# Patient Record
Sex: Female | Born: 1955 | Race: Black or African American | Hispanic: No | Marital: Single
Health system: Southern US, Community
[De-identification: ages and names within clinical notes are randomized; demographics above are authoritative.]

---

## 2019-10-28 ENCOUNTER — Other Ambulatory Visit: Payer: Self-pay

## 2019-10-28 ENCOUNTER — Ambulatory Visit (HOSPITAL_COMMUNITY)
Admission: AD | Admit: 2019-10-28 | Discharge: 2019-10-28 | Disposition: A | Discharge status: Home / Self Care | Payer: Medicare Other | Source: Home / Self Care | Attending: Psychiatry | Admitting: Psychiatry

## 2019-10-28 ENCOUNTER — Emergency Department (HOSPITAL_COMMUNITY)
Admission: EM | Admit: 2019-10-28 | Discharge: 2019-10-31 | Disposition: A | Discharge status: Home / Self Care | Payer: Medicare Other | Attending: Emergency Medicine | Admitting: Emergency Medicine

## 2019-10-28 ENCOUNTER — Emergency Department (HOSPITAL_COMMUNITY): Payer: Medicare Other

## 2019-10-28 DIAGNOSIS — Z79899 Other long term (current) drug therapy: Secondary | ICD-10-CM | POA: Insufficient documentation

## 2019-10-28 DIAGNOSIS — F419 Anxiety disorder, unspecified: Secondary | ICD-10-CM | POA: Diagnosis not present

## 2019-10-28 DIAGNOSIS — F329 Major depressive disorder, single episode, unspecified: Secondary | ICD-10-CM | POA: Diagnosis present

## 2019-10-28 DIAGNOSIS — Z20822 Contact with and (suspected) exposure to covid-19: Secondary | ICD-10-CM | POA: Diagnosis not present

## 2019-10-28 DIAGNOSIS — R4182 Altered mental status, unspecified: Secondary | ICD-10-CM | POA: Insufficient documentation

## 2019-10-28 DIAGNOSIS — F332 Major depressive disorder, recurrent severe without psychotic features: Secondary | ICD-10-CM | POA: Diagnosis not present

## 2019-10-28 DIAGNOSIS — F29 Unspecified psychosis not due to a substance or known physiological condition: Secondary | ICD-10-CM

## 2019-10-28 LAB — CBC WITH DIFFERENTIAL/PLATELET
Abs Immature Granulocytes: 0.02 10*3/uL (ref 0.00–0.07)
Basophils Absolute: 0 10*3/uL (ref 0.0–0.1)
Basophils Relative: 0 %
Eosinophils Absolute: 0 10*3/uL (ref 0.0–0.5)
Eosinophils Relative: 0 %
HCT: 35.2 % — ABNORMAL LOW (ref 36.0–46.0)
Hemoglobin: 11.4 g/dL — ABNORMAL LOW (ref 12.0–15.0)
Immature Granulocytes: 0 %
Lymphocytes Relative: 20 %
Lymphs Abs: 1.6 10*3/uL (ref 0.7–4.0)
MCH: 28.6 pg (ref 26.0–34.0)
MCHC: 32.4 g/dL (ref 30.0–36.0)
MCV: 88.4 fL (ref 80.0–100.0)
Monocytes Absolute: 0.3 10*3/uL (ref 0.1–1.0)
Monocytes Relative: 4 %
Neutro Abs: 6.2 10*3/uL (ref 1.7–7.7)
Neutrophils Relative %: 76 %
Platelets: 315 10*3/uL (ref 150–400)
RBC: 3.98 MIL/uL (ref 3.87–5.11)
RDW: 13.9 % (ref 11.5–15.5)
WBC: 8.2 10*3/uL (ref 4.0–10.5)
nRBC: 0 % (ref 0.0–0.2)

## 2019-10-28 LAB — COMPREHENSIVE METABOLIC PANEL
ALT: 26 U/L (ref 0–44)
AST: 26 U/L (ref 15–41)
Albumin: 4.2 g/dL (ref 3.5–5.0)
Alkaline Phosphatase: 60 U/L (ref 38–126)
Anion gap: 11 (ref 5–15)
BUN: 14 mg/dL (ref 8–23)
CO2: 25 mmol/L (ref 22–32)
Calcium: 9.5 mg/dL (ref 8.9–10.3)
Chloride: 104 mmol/L (ref 98–111)
Creatinine, Ser: 0.88 mg/dL (ref 0.44–1.00)
GFR calc Af Amer: >60 mL/min (ref >60)
GFR calc non Af Amer: >60 mL/min (ref >60)
Glucose, Bld: 122 mg/dL — ABNORMAL HIGH (ref 70–99)
Potassium: 4.1 mmol/L (ref 3.5–5.1)
Sodium: 140 mmol/L (ref 135–145)
Total Bilirubin: 0.7 mg/dL (ref 0.3–1.2)
Total Protein: 7.4 g/dL (ref 6.5–8.1)

## 2019-10-28 LAB — RAPID URINE DRUG SCREEN, HOSP PERFORMED
Amphetamines: NOT DETECTED
Barbiturates: NOT DETECTED
Benzodiazepines: NOT DETECTED
Cocaine: NOT DETECTED
Opiates: NOT DETECTED
Tetrahydrocannabinol: NOT DETECTED

## 2019-10-28 LAB — URINALYSIS, ROUTINE W REFLEX MICROSCOPIC
Bilirubin Urine: NEGATIVE
Glucose, UA: NEGATIVE mg/dL
Hgb urine dipstick: NEGATIVE
Ketones, ur: NEGATIVE mg/dL
Leukocytes,Ua: NEGATIVE
Nitrite: NEGATIVE
Protein, ur: NEGATIVE mg/dL
Specific Gravity, Urine: 1.01 (ref 1.005–1.030)
pH: 6 (ref 5.0–8.0)

## 2019-10-28 LAB — AMMONIA: Ammonia: 25 umol/L (ref 9–35)

## 2019-10-28 LAB — RESPIRATORY PANEL BY RT PCR (FLU A&B, COVID)
Influenza A by PCR: NEGATIVE
Influenza B by PCR: NEGATIVE
SARS Coronavirus 2 by RT PCR: NEGATIVE

## 2019-10-28 LAB — SALICYLATE LEVEL: Salicylate Lvl: <7 mg/dL — ABNORMAL LOW (ref 7.0–30.0)

## 2019-10-28 LAB — ETHANOL: Alcohol, Ethyl (B): <10 mg/dL (ref <10)

## 2019-10-28 LAB — ACETAMINOPHEN LEVEL: Acetaminophen (Tylenol), Serum: <10 ug/mL — ABNORMAL LOW (ref 10–30)

## 2019-10-28 MED ORDER — ACETAMINOPHEN 325 MG PO TABS
650.0000 mg | ORAL_TABLET | Freq: Four times a day (QID) | ORAL | Status: DC | PRN
  Administered 2019-10-28: 20:00:00 650 mg via ORAL
  Filled 2019-10-28: qty 2

## 2019-10-28 MED ORDER — HYDROXYZINE HCL 25 MG PO TABS
25.0000 mg | ORAL_TABLET | Freq: Once | ORAL | Status: AC
  Administered 2019-10-28: 25 mg via ORAL
  Filled 2019-10-28: qty 1

## 2019-10-28 NOTE — Progress Notes (Signed)
Patient meets inpatient criteria per Malachy Chamber, NP. Patient has been faxed out to the following facilities for review:   [facility]  [facility]  [facility]  [facility]  [facility]  TTS will continue to follow and assist with securing bed placement.   Drucilla Schmidt, MSW, LCSW-A Clinical Disposition Social Worker [facility]/TTS [phone]

## 2019-10-28 NOTE — ED Provider Notes (Addendum)
[name] Provider Note   CSN: [id number] Arrival date & time: 10/28/19  1636     History Chief Complaint  Patient presents with  . Medical Clearance    IVC    Christina Cruz is a 64 y.o. female.  Patient is a 64 year old female without significant medical history presenting today under IVC due to bizarre behavior, paranoia, threats to hurt her daughter and hallucinating and talking to her dead mother.  Patient has no prior psychiatric diagnosis but daughter states this has happened before.  Patient does admit to taking large amounts of caffeine and phentermine.  She reports she has not been sleeping well and sometimes takes medication that makes her drowsy.  She is unable to give much history and continues to look at the bed and in the drawers like she is looking for something but will not say what.  She denies any chest pain, shortness of breath or abdominal pain.  She reports she has been eating okay.  Patient was seen by behavioral health and sent over for medical clearance.  The history is provided by medical records and a relative.       No past medical history on file.  There are no problems to display for this patient.      OB History   No obstetric history on file.     No family history on file.  Social History   Tobacco Use  . Smoking status: Not on file  Substance Use Topics  . Alcohol use: Not on file  . Drug use: Not on file    Home Medications Prior to Admission medications   Not on File    Allergies    Patient has no allergy information on record.  Review of Systems   Review of Systems  All other systems reviewed and are negative.   Physical Exam Updated Vital Signs BP 131/87 (BP Location: Right Arm)   Pulse (!) 105   Temp 98.6 F (37 C) (Oral)   Resp 16   SpO2 100%   Physical Exam Vitals and nursing note reviewed.  Constitutional:      General: She is not in acute distress.    Appearance:  Normal appearance. She is well-developed and normal weight.  HENT:     Head: Normocephalic and atraumatic.  Eyes:     Pupils: Pupils are equal, round, and reactive to light.     Comments: Folds are 3 mm bilaterally and reactive  Cardiovascular:     Rate and Rhythm: Regular rhythm. Tachycardia present.     Heart sounds: Normal heart sounds. No murmur. No friction rub.  Pulmonary:     Effort: Pulmonary effort is normal.     Breath sounds: Normal breath sounds. No wheezing or rales.  Abdominal:     General: Bowel sounds are normal. There is no distension.     Palpations: Abdomen is soft.     Tenderness: There is no abdominal tenderness. There is no guarding or rebound.  Musculoskeletal:        General: No tenderness. Normal range of motion.     Comments: No edema  Skin:    General: Skin is warm and dry.     Findings: No rash.  Neurological:     Mental Status: She is alert and oriented to person, place, and time.     Cranial Nerves: No cranial nerve deficit.     Comments: No tremor or clonus noted  Psychiatric:  Comments: Patient with bizarre behavior.  She seems drowsy but yet is also hyperactive.  She is looking at the bed looking under the bed opening the closet doors but cannot state what she is looking for.  Also she seems very disorganized and difficult to understand.     ED Results / Procedures / Treatments   Labs (all labs ordered are listed, but only abnormal results are displayed) Labs Reviewed  CBC WITH DIFFERENTIAL/PLATELET - Abnormal; Notable for the following components:      Result Value   Hemoglobin 11.4 (*)    HCT 35.2 (*)    All other components within normal limits  COMPREHENSIVE METABOLIC PANEL - Abnormal; Notable for the following components:   Glucose, Bld 122 (*)    All other components within normal limits  SALICYLATE LEVEL - Abnormal; Notable for the following components:   Salicylate Lvl <7.6 (*)    All other components within normal limits    ACETAMINOPHEN LEVEL - Abnormal; Notable for the following components:   Acetaminophen (Tylenol), Serum <10 (*)    All other components within normal limits  ETHANOL  RAPID URINE DRUG SCREEN, HOSP PERFORMED  AMMONIA  URINALYSIS, ROUTINE W REFLEX MICROSCOPIC    EKG EKG Interpretation  Date/Time:  Friday October 28 2019 17:26:36 EDT Ventricular Rate:  95 PR Interval:  176 QRS Duration: 82 QT Interval:  346 QTC Calculation: 434 R Axis:   171 Text Interpretation: Normal sinus rhythm Right axis deviation No previous tracing Confirmed by Blanchie Dessert [phone]) on 10/28/2019 5:37:22 PM   Radiology CT Head Wo Contrast  Result Date: 10/28/2019 CLINICAL DATA:  Altered mental status (AMS), unclear cause. EXAM: CT HEAD WITHOUT CONTRAST TECHNIQUE: Contiguous axial images were obtained from the base of the skull through the vertex without intravenous contrast. COMPARISON:  No pertinent prior studies available for comparison. FINDINGS: Brain: There is no acute intracranial hemorrhage. No demarcated cortical infarct. No extra-axial fluid collection. No evidence of intracranial mass. No midline shift. Vascular: No hyperdense vessel. Skull: Normal. Negative for fracture or focal lesion. Sinuses/Orbits: Visualized orbits show no acute finding. No significant paranasal sinus disease or mastoid effusion at the imaged levels. IMPRESSION: Unremarkable non-contrast CT appearance of the brain. No evidence of acute intracranial abnormality. Electronically Signed   By: Kellie Simmering DO   On: 10/28/2019 18:33   DG Chest Port 1 View  Result Date: 10/28/2019 CLINICAL DATA:  Medical clearance for facility placement, retic behavior and medical noncompliance, increasing aggression EXAM: PORTABLE CHEST 1 VIEW COMPARISON:  None. FINDINGS: No consolidation, features of edema, pneumothorax, or effusion. Pulmonary vascularity is normally distributed. The aorta is calcified. The remaining cardiomediastinal contours are  unremarkable. No acute osseous or soft tissue abnormality. Degenerative changes are present in the imaged spine and shoulders. IMPRESSION: No acute cardiopulmonary abnormality. Aortic Atherosclerosis (ICD10-I70.0). Electronically Signed   By: Lovena Le M.D.   On: 10/28/2019 18:11    Procedures Procedures (including critical care time)  Medications Ordered in ED Medications - No data to display  ED Course  I have reviewed the triage vital signs and the nursing notes.  Pertinent labs & imaging results that were available during my care of the patient were reviewed by me and considered in my medical decision making (see chart for details).    MDM Rules/Calculators/A&P                      64 year old female presenting today with psychosis, general delayed speech with some  mild slurred speech but also erratic bizarre behavior.  Patient was seen by behavioral health and this all could be psychiatric illness however given patient is 89 without much prior history and no psychiatric diagnosis patient needs medical clearance first.  Patient has no focal weakness and is able to walk around the room without difficulty.  No strokelike symptoms at this time.  Potential ingestion versus brain tumor versus abnormal electrolytes as patient's cause.  Will medically clear before contacting psychiatry.  Patient is currently under IVC.  6:29 PM Pt's labs all reassuring and EKG without acute pathology.  CXR wnl and head CT is neg.  Feel that pt is medically clear and stable for psych eval.  First impression completed.  MDM Number of Diagnoses or Management Options   Amount and/or Complexity of Data Reviewed Clinical lab tests: ordered and reviewed Tests in the radiology section of CPT: ordered and reviewed Tests in the medicine section of CPT: ordered and reviewed Decide to obtain previous medical records or to obtain history from someone other than the patient: yes Obtain history from someone other  than the patient: yes Review and summarize past medical records: yes Discuss the patient with other providers: yes Independent visualization of images, tracings, or specimens: yes  Risk of Complications, Morbidity, and/or Mortality Presenting problems: moderate Diagnostic procedures: low Management options: low  Patient Progress Patient progress: stable    Final Clinical Impression(s) / ED Diagnoses Final diagnoses:  Psychosis, unspecified psychosis type [facility])    Rx / DC Orders ED Discharge Orders    None       Gwyneth Sprout, MD 10/28/19 1831    Gwyneth Sprout, MD 10/28/19 [phone]
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
## 2019-10-28 NOTE — ED Triage Notes (Signed)
Pt brought to [facility] from [facility] by [employer]. Pt is IVC . Erratic behavior and non compliance of medication.pt has bee aggressive with family.  Pt has a hx of mental illness. Pt cooperative, dressed out, rambling, slurred speech.

## 2019-10-28 NOTE — ED Notes (Signed)
Patient resting quietly.

## 2019-10-28 NOTE — H&P (Signed)
Behavioral Health Medical Screening Exam  Christina Cruz is an 64 y.o. female with psychosis, paranoia, bizarre behaviors. She is under IVC by her daughter for the above. She denies any previous history of psych, however daughter reports this has happened before many years ago. Patient with garbled speech yet she remains coherent throughout the evaluation.    Total Time spent with patient: 30 minutes  Psychiatric Specialty Exam: Physical Exam  Nursing note and vitals reviewed. Constitutional: She appears well-developed and well-nourished.  HENT:  Head: Normocephalic.  Eyes: Pupils are equal, round, and reactive to light.  Cardiovascular: Normal rate.  Musculoskeletal:        General: Normal range of motion.     Cervical back: Normal range of motion.  Neurological: She is alert.  Skin: Skin is warm and dry.    Review of Systems  There were no vitals taken for this visit.There is no height or weight on file to calculate BMI.  General Appearance: Fairly Groomed  Eye Contact:  Fair  Speech:  Garbled, Normal Rate and clear at times  Volume:  Normal  Mood:  Anxious  Affect:  Blunt and Restricted  Thought Process:  Disorganized, Irrelevant and Descriptions of Associations: Loose  Orientation:  Full (Time, Place, and Person)  Thought Content:  Illogical and Rumination  Suicidal Thoughts:  No  Homicidal Thoughts:  No  Memory:  Immediate;   Fair Recent;   Fair  Judgement:  Impaired  Insight:  Shallow  Psychomotor Activity:  Restlessness  Concentration: Concentration: Fair and Attention Span: Fair  Recall:  Poor  Fund of Knowledge:Poor  Language: Fair  Akathisia:  No  Handed:  Right  AIMS (if indicated):     Assets:  Communication Skills Desire for Improvement Financial Resources/Insurance Leisure Time Physical Health Social Support Transportation Vocational/Educational  Sleep:       Musculoskeletal: Strength & Muscle Tone: within normal limits Gait & Station:  normal Patient leans: N/A  There were no vitals taken for this visit.  Recommendations:   Based on my evaluation the patient appears to have an emergency medical condition for which I recommend the patient be transferred to the emergency department for further evaluation. Will send for medical clearance at this time. Patient wirh suspected abuse of phentermine, and ingestion of large amounts of caffeine daily. SHe has history of valve prolapse. Once she is medically cleared will fax out for psych. Patient is independent of all ADLs and appears to be groomed well, no apparent need for geropsych.   Maryagnes Amos, FNP 10/28/2019, 3:41 PM

## 2019-10-29 DIAGNOSIS — F332 Major depressive disorder, recurrent severe without psychotic features: Secondary | ICD-10-CM | POA: Diagnosis present

## 2019-10-29 MED ORDER — OLANZAPINE 2.5 MG PO TABS
2.5000 mg | ORAL_TABLET | Freq: Two times a day (BID) | ORAL | Status: DC
  Administered 2019-10-29 – 2019-10-31 (×4): 2.5 mg via ORAL
  Filled 2019-10-29 (×5): qty 1

## 2019-10-29 MED ORDER — BUPROPION HCL ER (SR) 100 MG PO TB12
200.0000 mg | ORAL_TABLET | Freq: Two times a day (BID) | ORAL | Status: DC
  Administered 2019-10-29: 200 mg via ORAL
  Filled 2019-10-29: qty 2

## 2019-10-29 MED ORDER — CITALOPRAM HYDROBROMIDE 10 MG PO TABS
20.0000 mg | ORAL_TABLET | Freq: Every day | ORAL | Status: DC
  Administered 2019-10-29 – 2019-10-31 (×3): 20 mg via ORAL
  Filled 2019-10-29 (×3): qty 2

## 2019-10-29 MED ORDER — HYDROXYZINE HCL 25 MG PO TABS
25.0000 mg | ORAL_TABLET | Freq: Three times a day (TID) | ORAL | Status: DC | PRN
  Administered 2019-10-29: 25 mg via ORAL
  Filled 2019-10-29: qty 1

## 2019-10-29 MED ORDER — QUETIAPINE FUMARATE 100 MG PO TABS
100.0000 mg | ORAL_TABLET | Freq: Every day | ORAL | Status: DC
  Administered 2019-10-29: 100 mg via ORAL
  Filled 2019-10-29: qty 1

## 2019-10-29 NOTE — Consult Note (Signed)
[facility] Face-to-Face Psychiatry Consult   Reason for Consult:  Psychosis Referring Physician:  EDP Patient Identification: Christina Cruz MRN:  [id number] Principal Diagnosis: Major depressive disorder, recurrent severe without psychotic features (HCC) Diagnosis:  Principal Problem:   Major depressive disorder, recurrent severe without psychotic features (HCC)  Total Time spent with patient: 1 hour  Subjective:   Christina Cruz is a 64 y.o. female patient admitted with suicidal ideations and plan to overdose.  Patient seen and evaluated in person by this provider.  She reports, "I have been seriously depressed for the past two months, having difficulty getting words out."  No difficulty talking during the assessment. 10/10 depression with suicidal ideations,plan to overdose.  Past history of depression and reports a bipolar diagnosis--chart review vacillates on diagnoses.  Reports taking her medications "on and off".  Anxious abut the stress in her life, especially to her now homeless status as she was evicted from her hotel yesterday.  Denies homicidal ideations, hallucinations, and mania symptoms. Her daughter reports she abuses phentermine and alcohol, BAL negative on admission.  Patient denies using alcohol.    HPI per MD on admission:  Patient is a 64 year old female without significant medical history presenting today under IVC due to bizarre behavior, paranoia, threats to hurt her daughter and hallucinating and talking to her dead mother.  Patient has no prior psychiatric diagnosis but daughter states this has happened before.  Patient does admit to taking large amounts of caffeine and phentermine.  She reports she has not been sleeping well and sometimes takes medication that makes her drowsy.  She is unable to give much history and continues to look at the bed and in the drawers like she is looking for something but will not say what.  She denies any chest pain, shortness of breath or abdominal  pain.  She reports she has been eating okay.  Patient was seen by behavioral health and sent over for medical clearance  Past Psychiatric History:  Depression, anxiety  Risk to Self:  yes Risk to Others:  none Prior Inpatient Therapy:  yes Prior Outpatient Therapy:  yes  Past Medical History: Denies Family History: No family history on file. Family Psychiatric  History: none Social History:  Social History   Substance and Sexual Activity  Alcohol Use Not on file     Social History   Substance and Sexual Activity  Drug Use Not on file    Social History   Socioeconomic History  . Marital status: Single    Spouse name: Not on file  . Number of children: Not on file  . Years of education: Not on file  . Highest education level: Not on file  Occupational History  . Not on file  Tobacco Use  . Smoking status: Not on file  Substance and Sexual Activity  . Alcohol use: Not on file  . Drug use: Not on file  . Sexual activity: Not on file  Other Topics Concern  . Not on file  Social History Narrative  . Not on file   Social Determinants of Health   Financial Resource Strain:   . Difficulty of Paying Living Expenses:   Food Insecurity:   . Worried About Programme researcher, broadcasting/film/video in the Last Year:   . Barista in the Last Year:   Transportation Needs:   . Freight forwarder (Medical):   Marland Kitchen Lack of Transportation (Non-Medical):   Physical Activity:   . Days of Exercise per Week:   .  Minutes of Exercise per Session:   Stress:   . Feeling of Stress :   Social Connections:   . Frequency of Communication with Friends and Family:   . Frequency of Social Gatherings with Friends and Family:   . Attends Religious Services:   . Active Member of Clubs or Organizations:   . Attends BankerClub or Organization Meetings:   Marland Kitchen. Marital Status:    Additional Social History:    Allergies:  No Known Allergies  Labs:  Results for orders placed or performed during the hospital  encounter of 10/28/19 (from the past 48 hour(s))  Rapid urine drug screen (hospital performed)     Status: None   Collection Time: 10/28/19  5:12 PM  Result Value Ref Range   Opiates NONE DETECTED NONE DETECTED   Cocaine NONE DETECTED NONE DETECTED   Benzodiazepines NONE DETECTED NONE DETECTED   Amphetamines NONE DETECTED NONE DETECTED   Tetrahydrocannabinol NONE DETECTED NONE DETECTED   Barbiturates NONE DETECTED NONE DETECTED    Comment: (NOTE) DRUG SCREEN FOR MEDICAL PURPOSES ONLY.  IF CONFIRMATION IS NEEDED FOR ANY PURPOSE, NOTIFY LAB WITHIN 5 DAYS. LOWEST DETECTABLE LIMITS FOR URINE DRUG SCREEN Drug Class                     Cutoff (ng/mL) Amphetamine and metabolites    1000 Barbiturate and metabolites    200 Benzodiazepine                 200 Tricyclics and metabolites     300 Opiates and metabolites        300 Cocaine and metabolites        300 THC                            50 Performed at [facility], [address] [place], [place] [address]   Urinalysis, Routine w reflex microscopic     Status: None   Collection Time: 10/28/19  5:12 PM  Result Value Ref Range   Color, Urine YELLOW YELLOW   APPearance CLEAR CLEAR   Specific Gravity, Urine 1.010 1.005 - 1.030   pH 6.0 5.0 - 8.0   Glucose, UA NEGATIVE NEGATIVE mg/dL   Hgb urine dipstick NEGATIVE NEGATIVE   Bilirubin Urine NEGATIVE NEGATIVE   Ketones, ur NEGATIVE NEGATIVE mg/dL   Protein, ur NEGATIVE NEGATIVE mg/dL   Nitrite NEGATIVE NEGATIVE   Leukocytes,Ua NEGATIVE NEGATIVE    Comment: Performed at [facility], [address] [place], [place] [address]  CBC with Differential/Platelet     Status: Abnormal   Collection Time: 10/28/19  5:39 PM  Result Value Ref Range   WBC 8.2 4.0 - 10.5 K/uL   RBC 3.98 3.87 - 5.11 MIL/uL   Hemoglobin 11.4 (L) 12.0 - 15.0 g/dL   HCT 24.435.2 (L) 01.036.0 - 27.246.0 %   MCV 88.4 80.0 - 100.0 fL   MCH 28.6 26.0 - 34.0 pg   MCHC 32.4 30.0 - 36.0  g/dL   RDW 53.613.9 64.411.5 - 03.415.5 %   Platelets 315 150 - 400 K/uL   nRBC 0.0 0.0 - 0.2 %   Neutrophils Relative % 76 %   Neutro Abs 6.2 1.7 - 7.7 K/uL   Lymphocytes Relative 20 %   Lymphs Abs 1.6 0.7 - 4.0 K/uL   Monocytes Relative 4 %   Monocytes Absolute 0.3 0.1 - 1.0 K/uL   Eosinophils Relative  0 %   Eosinophils Absolute 0.0 0.0 - 0.5 K/uL   Basophils Relative 0 %   Basophils Absolute 0.0 0.0 - 0.1 K/uL   Immature Granulocytes 0 %   Abs Immature Granulocytes 0.02 0.00 - 0.07 K/uL    Comment: Performed at [facility], [address], [place], [place] [address]  Comprehensive metabolic panel     Status: Abnormal   Collection Time: 10/28/19  5:39 PM  Result Value Ref Range   Sodium 140 135 - 145 mmol/L   Potassium 4.1 3.5 - 5.1 mmol/L   Chloride 104 98 - 111 mmol/L   CO2 25 22 - 32 mmol/L   Glucose, Bld 122 (H) 70 - 99 mg/dL    Comment: Glucose reference range applies only to samples taken after fasting for at least 8 hours.   BUN 14 8 - 23 mg/dL   Creatinine, Ser 6.06 0.44 - 1.00 mg/dL   Calcium 9.5 8.9 - 30.1 mg/dL   Total Protein 7.4 6.5 - 8.1 g/dL   Albumin 4.2 3.5 - 5.0 g/dL   AST 26 15 - 41 U/L   ALT 26 0 - 44 U/L   Alkaline Phosphatase 60 38 - 126 U/L   Total Bilirubin 0.7 0.3 - 1.2 mg/dL   GFR calc non Af Amer >60 >60 mL/min   GFR calc Af Amer >60 >60 mL/min   Anion gap 11 5 - 15    Comment: Performed at [facility], [address], [place], [place] [address]  Ethanol     Status: None   Collection Time: 10/28/19  5:39 PM  Result Value Ref Range   Alcohol, Ethyl (B) <10 <10 mg/dL    Comment: (NOTE) Lowest detectable limit for serum alcohol is 10 mg/dL. For medical purposes only. Performed at [facility], [address], [place], [place] [address]   Salicylate level     Status: Abnormal   Collection Time: 10/28/19  5:39 PM  Result Value Ref Range   Salicylate Lvl <7.0 (L) 7.0 - 30.0 mg/dL    Comment:  Performed at [facility], [address], [place], [place] [address]  Acetaminophen level     Status: Abnormal   Collection Time: 10/28/19  5:39 PM  Result Value Ref Range   Acetaminophen (Tylenol), Serum <10 (L) 10 - 30 ug/mL    Comment: (NOTE) Therapeutic concentrations vary significantly. A range of 10-30 ug/mL  may be an effective concentration for many patients. However, some  are best treated at concentrations outside of this range. Acetaminophen concentrations >150 ug/mL at 4 hours after ingestion  and >50 ug/mL at 12 hours after ingestion are often associated with  toxic reactions. Performed at [facility], [address] [place], [place] [address]   Ammonia     Status: None   Collection Time: 10/28/19  5:39 PM  Result Value Ref Range   Ammonia 25 9 - 35 umol/L    Comment: Performed at [facility], [address] [place], [place] [address]  Respiratory Panel by RT PCR (Flu A&B, Covid) - Nasopharyngeal Swab     Status: None   Collection Time: 10/28/19  6:53 PM   Specimen: Nasopharyngeal Swab  Result Value Ref Range   SARS Coronavirus 2 by RT PCR NEGATIVE NEGATIVE    Comment: (NOTE) SARS-CoV-2 target nucleic acids are NOT DETECTED. The SARS-CoV-2 RNA is generally detectable in upper respiratoy specimens during the acute phase of  infection. The lowest concentration of SARS-CoV-2 viral copies this assay can detect is 131 copies/mL. A negative result does not preclude SARS-Cov-2 infection and should not be used as the sole basis for treatment or other patient management decisions. A negative result may occur with  improper specimen collection/handling, submission of specimen other than nasopharyngeal swab, presence of viral mutation(s) within the areas targeted by this assay, and inadequate number of viral copies (<131 copies/mL). A negative result must be combined with clinical observations, patient history, and  epidemiological information. The expected result is Negative. Fact Sheet for Patients:  PinkCheek.be Fact Sheet for Healthcare Providers:  GravelBags.it This test is not yet ap proved or cleared by the [place] FDA and  has been authorized for detection and/or diagnosis of SARS-CoV-2 by FDA under an Emergency Use Authorization (EUA). This EUA will remain  in effect (meaning this test can be used) for the duration of the COVID-19 declaration under Section 564(b)(1) of the Act, 21 U.S.C. section 360bbb-3(b)(1), unless the authorization is terminated or revoked sooner.    Influenza A by PCR NEGATIVE NEGATIVE   Influenza B by PCR NEGATIVE NEGATIVE    Comment: (NOTE) The Xpert Xpress SARS-CoV-2/FLU/RSV assay is intended as an aid in  the diagnosis of influenza from Nasopharyngeal swab specimens and  should not be used as a sole basis for treatment. Nasal washings and  aspirates are unacceptable for Xpert Xpress SARS-CoV-2/FLU/RSV  testing. Fact Sheet for Patients: PinkCheek.be Fact Sheet for Healthcare Providers: GravelBags.it This test is not yet approved or cleared by the [place] FDA and  has been authorized for detection and/or diagnosis of SARS-CoV-2 by  FDA under an Emergency Use Authorization (EUA). This EUA will remain  in effect (meaning this test can be used) for the duration of the  Covid-19 declaration under Section 564(b)(1) of the Act, 21  U.S.C. section 360bbb-3(b)(1), unless the authorization is  terminated or revoked. Performed at [facility], [place] [address] [place], [place] [address]     Current Facility-Administered Medications  Medication Dose Route Frequency Provider Last Rate Last Admin  . acetaminophen (TYLENOL) tablet 650 mg  650 mg Oral Q6H PRN Blanchie Dessert, MD   650 mg at 10/28/19 2024  . buPROPion  [facility] SR) 12 hr tablet 200 mg  200 mg Oral BID Ward, Kristen N, DO   200 mg at 10/29/19 8841  . hydrOXYzine (ATARAX/VISTARIL) tablet 25 mg  25 mg Oral TID PRN Ward, Delice Bison, DO      . QUEtiapine (SEROQUEL) tablet 100 mg  100 mg Oral QHS Ward, Kristen N, DO   100 mg at 10/29/19 0124   Current Outpatient Medications  Medication Sig Dispense Refill  . buPROPion (WELLBUTRIN SR) 200 MG 12 hr tablet Take 200 mg by mouth 2 (two) times daily.    . hydrOXYzine (VISTARIL) 25 MG capsule 25-50 mg every 8 (eight) hours as needed for anxiety.     Marland Kitchen QUEtiapine (SEROQUEL) 100 MG tablet Take 100 mg by mouth at bedtime.      Musculoskeletal: Strength & Muscle Tone: within normal limits Gait & Station: normal Patient leans: N/A  Psychiatric Specialty Exam: Physical Exam  Nursing note and vitals reviewed. Constitutional: She is oriented to person, place, and time. She appears well-developed and well-nourished.  HENT:  Head: Normocephalic.  Respiratory: Effort normal.  Musculoskeletal:        General: Normal range of motion.     Cervical back: Normal range of motion.  Neurological:  She is alert and oriented to person, place, and time.  Psychiatric: Her speech is normal and behavior is normal. Her mood appears anxious. Cognition and memory are normal. She expresses impulsivity. She exhibits a depressed mood. She expresses suicidal ideation.    Review of Systems  Psychiatric/Behavioral: Positive for dysphoric mood and suicidal ideas. The patient is nervous/anxious.   All other systems reviewed and are negative.   Blood pressure 122/81, pulse (!) 106, temperature 98.5 F (36.9 C), temperature source Oral, resp. rate 18, SpO2 99 %.There is no height or weight on file to calculate BMI.  General Appearance: Casual  Eye Contact:  Good  Speech:  Clear and Coherent  Volume:  Normal  Mood:  Anxious and Depressed  Affect:  Congruent  Thought Process:  Coherent and Descriptions of Associations:  Intact  Orientation:  Full (Time, Place, and Person)  Thought Content:  Rumination  Suicidal Thoughts:  Yes.  with intent/plan  Homicidal Thoughts:  No  Memory:  Immediate;   Fair Recent;   Fair Remote;   Fair  Judgement:  Poor  Insight:  Fair  Psychomotor Activity:  Normal  Concentration:  Concentration: Fair and Attention Span: Fair  Recall:  [employer] of Knowledge:  Fair  Language:  Good  Akathisia:  No  Handed:  Right  AIMS (if indicated):     Assets:  Leisure Time Physical Health Resilience Social Support  ADL's:  Intact  Cognition:  WNL  Sleep:        Treatment Plan Summary: Daily contact with patient to assess and evaluate symptoms and progress in treatment, Medication management and Plan major depressive disorder, recurrent, severe without psychosis:  -Discontinued Wellbutrin 200 mg BID, has not been taking it -Started Celexa 20 mg daily -Discontinued Seroquel 100 mg at bedtime -Started Zyprexa 2.5 mg BID  Disposition: Recommend psychiatric Inpatient admission when medically cleared.  Nanine Means, NP 10/29/2019 9:52 AM

## 2019-10-29 NOTE — ED Notes (Signed)
Pt showered without assistance and the linen in the room was changed.

## 2019-10-29 NOTE — ED Notes (Signed)
Pt expressed concern about her belongings, especially her purse and ID being locked up at the [facility]. This Clinical research associate verified for her that the only belongings here are the clothes she was wearing including shoes, a cell phone with charger and pair of glasses.

## 2019-10-30 NOTE — Progress Notes (Signed)
Pt has been accepted to [facility] per [facility] with admissions. Official acceptance will be provided by Morrie Sheldon on Monday 10/31/19 once discharges from their unit are completed.    Ruthann Cancer MSW, LCSWA Clincal Social Worker Disposition  [facility] Ph: [phone] Fax: [phone]

## 2019-10-30 NOTE — Consult Note (Signed)
[facility] Face-to-Face Psychiatry Consult   Reason for Consult:  Psychosis Referring Physician:  EDP Patient Identification: Christina Cruz MRN:  [id number] Principal Diagnosis: Major depressive disorder, recurrent severe without psychotic features (HCC) Diagnosis:  Principal Problem:   Major depressive disorder, recurrent severe without psychotic features (HCC)  Total Time spent with patient: 1 hour  Subjective:   Christina Cruz is a 64 y.o. female patient admitted with suicidal ideations and plan to overdose.  Patient seen and evaluated in person by this provider.  She reports slight improvement after resting, eating, and medications.  Continues to endorse suicidal ideations with inability to contract for safety.  She has been accepted to [facility] geriatric psychiatry pending EKG and IVC paperwork at this time and will transfer in the morning.  10/29/19: Patient seen and evaluated in person by this provider.  She reports, "I have been seriously depressed for the past two months, having difficulty getting words out."  No difficulty talking during the assessment. 10/10 depression with suicidal ideations,plan to overdose.  Past history of depression and reports a bipolar diagnosis--chart review vacillates on diagnoses.  Reports taking her medications "on and off".  Anxious abut the stress in her life, especially to her now homeless status as she was evicted from her hotel yesterday.  Denies homicidal ideations, hallucinations, and mania symptoms. Her daughter reports she abuses phentermine and alcohol, BAL negative on admission.  Patient denies using alcohol.    HPI per MD on admission:  Patient is a 64 year old female without significant medical history presenting today under IVC due to bizarre behavior, paranoia, threats to hurt her daughter and hallucinating and talking to her dead mother.  Patient has no prior psychiatric diagnosis but daughter states this has happened before.  Patient does admit to  taking large amounts of caffeine and phentermine.  She reports she has not been sleeping well and sometimes takes medication that makes her drowsy.  She is unable to give much history and continues to look at the bed and in the drawers like she is looking for something but will not say what.  She denies any chest pain, shortness of breath or abdominal pain.  She reports she has been eating okay.  Patient was seen by behavioral health and sent over for medical clearance  Past Psychiatric History:  Depression, anxiety  Risk to Self:  yes Risk to Others:  none Prior Inpatient Therapy:  yes Prior Outpatient Therapy:  yes  Past Medical History: Denies Family History: No family history on file. Family Psychiatric  History: none Social History:  Social History   Substance and Sexual Activity  Alcohol Use Not on file     Social History   Substance and Sexual Activity  Drug Use Not on file    Social History   Socioeconomic History  . Marital status: Single    Spouse name: Not on file  . Number of children: Not on file  . Years of education: Not on file  . Highest education level: Not on file  Occupational History  . Not on file  Tobacco Use  . Smoking status: Not on file  Substance and Sexual Activity  . Alcohol use: Not on file  . Drug use: Not on file  . Sexual activity: Not on file  Other Topics Concern  . Not on file  Social History Narrative  . Not on file   Social Determinants of Health   Financial Resource Strain:   . Difficulty of Paying Living Expenses:   Food  Insecurity:   . Worried About Programme researcher, broadcasting/film/video in the Last Year:   . Barista in the Last Year:   Transportation Needs:   . Freight forwarder (Medical):   Marland Kitchen Lack of Transportation (Non-Medical):   Physical Activity:   . Days of Exercise per Week:   . Minutes of Exercise per Session:   Stress:   . Feeling of Stress :   Social Connections:   . Frequency of Communication with Friends and  Family:   . Frequency of Social Gatherings with Friends and Family:   . Attends Religious Services:   . Active Member of Clubs or Organizations:   . Attends Banker Meetings:   Marland Kitchen Marital Status:    Additional Social History:    Allergies:  No Known Allergies  Labs:  Results for orders placed or performed during the hospital encounter of 10/28/19 (from the past 48 hour(s))  Rapid urine drug screen (hospital performed)     Status: None   Collection Time: 10/28/19  5:12 PM  Result Value Ref Range   Opiates NONE DETECTED NONE DETECTED   Cocaine NONE DETECTED NONE DETECTED   Benzodiazepines NONE DETECTED NONE DETECTED   Amphetamines NONE DETECTED NONE DETECTED   Tetrahydrocannabinol NONE DETECTED NONE DETECTED   Barbiturates NONE DETECTED NONE DETECTED    Comment: (NOTE) DRUG SCREEN FOR MEDICAL PURPOSES ONLY.  IF CONFIRMATION IS NEEDED FOR ANY PURPOSE, NOTIFY LAB WITHIN 5 DAYS. LOWEST DETECTABLE LIMITS FOR URINE DRUG SCREEN Drug Class                     Cutoff (ng/mL) Amphetamine and metabolites    1000 Barbiturate and metabolites    200 Benzodiazepine                 200 Tricyclics and metabolites     300 Opiates and metabolites        300 Cocaine and metabolites        300 THC                            50 Performed at [facility], [address], [place], [place] [address]   Urinalysis, Routine w reflex microscopic     Status: None   Collection Time: 10/28/19  5:12 PM  Result Value Ref Range   Color, Urine YELLOW YELLOW   APPearance CLEAR CLEAR   Specific Gravity, Urine 1.010 1.005 - 1.030   pH 6.0 5.0 - 8.0   Glucose, UA NEGATIVE NEGATIVE mg/dL   Hgb urine dipstick NEGATIVE NEGATIVE   Bilirubin Urine NEGATIVE NEGATIVE   Ketones, ur NEGATIVE NEGATIVE mg/dL   Protein, ur NEGATIVE NEGATIVE mg/dL   Nitrite NEGATIVE NEGATIVE   Leukocytes,Ua NEGATIVE NEGATIVE    Comment: Performed at [facility], [address] [place], [place] [address]  CBC with Differential/Platelet     Status: Abnormal   Collection Time: 10/28/19  5:39 PM  Result Value Ref Range   WBC 8.2 4.0 - 10.5 K/uL   RBC 3.98 3.87 - 5.11 MIL/uL   Hemoglobin 11.4 (L) 12.0 - 15.0 g/dL   HCT 56.4 (L) 33.2 - 95.1 %   MCV 88.4 80.0 - 100.0 fL   MCH 28.6 26.0 - 34.0 pg   MCHC 32.4 30.0 - 36.0 g/dL   RDW 88.4 16.6 - 06.3 %   Platelets 315 150 - 400 K/uL  nRBC 0.0 0.0 - 0.2 %   Neutrophils Relative % 76 %   Neutro Abs 6.2 1.7 - 7.7 K/uL   Lymphocytes Relative 20 %   Lymphs Abs 1.6 0.7 - 4.0 K/uL   Monocytes Relative 4 %   Monocytes Absolute 0.3 0.1 - 1.0 K/uL   Eosinophils Relative 0 %   Eosinophils Absolute 0.0 0.0 - 0.5 K/uL   Basophils Relative 0 %   Basophils Absolute 0.0 0.0 - 0.1 K/uL   Immature Granulocytes 0 %   Abs Immature Granulocytes 0.02 0.00 - 0.07 K/uL    Comment: Performed at [facility], [place] [address] [place], [place] [address]  Comprehensive metabolic panel     Status: Abnormal   Collection Time: 10/28/19  5:39 PM  Result Value Ref Range   Sodium 140 135 - 145 mmol/L   Potassium 4.1 3.5 - 5.1 mmol/L   Chloride 104 98 - 111 mmol/L   CO2 25 22 - 32 mmol/L   Glucose, Bld 122 (H) 70 - 99 mg/dL    Comment: Glucose reference range applies only to samples taken after fasting for at least 8 hours.   BUN 14 8 - 23 mg/dL   Creatinine, Ser 0.88 0.44 - 1.00 mg/dL   Calcium 9.5 8.9 - 10.3 mg/dL   Total Protein 7.4 6.5 - 8.1 g/dL   Albumin 4.2 3.5 - 5.0 g/dL   AST 26 15 - 41 U/L   ALT 26 0 - 44 U/L   Alkaline Phosphatase 60 38 - 126 U/L   Total Bilirubin 0.7 0.3 - 1.2 mg/dL   GFR calc non Af Amer >60 >60 mL/min   GFR calc Af Amer >60 >60 mL/min   Anion gap 11 5 - 15    Comment: Performed at [facility], [place] [address], [place], [place] [address]  Ethanol     Status: None   Collection Time: 10/28/19  5:39 PM  Result Value Ref Range   Alcohol, Ethyl (B) <10 <10 mg/dL     Comment: (NOTE) Lowest detectable limit for serum alcohol is 10 mg/dL. For medical purposes only. Performed at [facility], [place] [address] [place], [place] [address]   Salicylate level     Status: Abnormal   Collection Time: 10/28/19  5:39 PM  Result Value Ref Range   Salicylate Lvl <2.4 (L) 7.0 - 30.0 mg/dL    Comment: Performed at [facility], [place] [address] [place], [place] [address]  Acetaminophen level     Status: Abnormal   Collection Time: 10/28/19  5:39 PM  Result Value Ref Range   Acetaminophen (Tylenol), Serum <10 (L) 10 - 30 ug/mL    Comment: (NOTE) Therapeutic concentrations vary significantly. A range of 10-30 ug/mL  may be an effective concentration for many patients. However, some  are best treated at concentrations outside of this range. Acetaminophen concentrations >150 ug/mL at 4 hours after ingestion  and >50 ug/mL at 12 hours after ingestion are often associated with  toxic reactions. Performed at [facility], [place] [address] [place], [place] [address]   Ammonia     Status: None   Collection Time: 10/28/19  5:39 PM  Result Value Ref Range   Ammonia 25 9 - 35 umol/L    Comment: Performed at [facility], [place] [address], [place], [place] [address]  Respiratory Panel by RT PCR (Flu A&B, Covid) - Nasopharyngeal Swab     Status:  None   Collection Time: 10/28/19  6:53 PM   Specimen: Nasopharyngeal Swab  Result Value Ref Range   SARS Coronavirus 2 by RT PCR NEGATIVE NEGATIVE    Comment: (NOTE) SARS-CoV-2 target nucleic acids are NOT DETECTED. The SARS-CoV-2 RNA is generally detectable in upper respiratoy specimens during the acute phase of infection. The lowest concentration of SARS-CoV-2 viral copies this assay can detect is 131 copies/mL. A negative result does not preclude SARS-Cov-2 infection and should not be used as the sole basis for treatment or other patient management  decisions. A negative result may occur with  improper specimen collection/handling, submission of specimen other than nasopharyngeal swab, presence of viral mutation(s) within the areas targeted by this assay, and inadequate number of viral copies (<131 copies/mL). A negative result must be combined with clinical observations, patient history, and epidemiological information. The expected result is Negative. Fact Sheet for Patients:  https://www.moore.com/ Fact Sheet for Healthcare Providers:  https://www.young.biz/ This test is not yet ap proved or cleared by the [place] FDA and  has been authorized for detection and/or diagnosis of SARS-CoV-2 by FDA under an Emergency Use Authorization (EUA). This EUA will remain  in effect (meaning this test can be used) for the duration of the COVID-19 declaration under Section 564(b)(1) of the Act, 21 U.S.C. section 360bbb-3(b)(1), unless the authorization is terminated or revoked sooner.    Influenza A by PCR NEGATIVE NEGATIVE   Influenza B by PCR NEGATIVE NEGATIVE    Comment: (NOTE) The Xpert Xpress SARS-CoV-2/FLU/RSV assay is intended as an aid in  the diagnosis of influenza from Nasopharyngeal swab specimens and  should not be used as a sole basis for treatment. Nasal washings and  aspirates are unacceptable for Xpert Xpress SARS-CoV-2/FLU/RSV  testing. Fact Sheet for Patients: https://www.moore.com/ Fact Sheet for Healthcare Providers: https://www.young.biz/ This test is not yet approved or cleared by the [place] FDA and  has been authorized for detection and/or diagnosis of SARS-CoV-2 by  FDA under an Emergency Use Authorization (EUA). This EUA will remain  in effect (meaning this test can be used) for the duration of the  Covid-19 declaration under Section 564(b)(1) of the Act, 21  U.S.C. section 360bbb-3(b)(1), unless the authorization is   terminated or revoked. Performed at [facility], [address] [place], [place] [address]     Current Facility-Administered Medications  Medication Dose Route Frequency Provider Last Rate Last Admin  . acetaminophen (TYLENOL) tablet 650 mg  650 mg Oral Q6H PRN Gwyneth Sprout, MD   650 mg at 10/28/19 2024  . citalopram (CELEXA) tablet 20 mg  20 mg Oral Daily Charm Rings, NP   20 mg at 10/30/19 0900  . hydrOXYzine (ATARAX/VISTARIL) tablet 25 mg  25 mg Oral TID PRN Ward, Layla Maw, DO   25 mg at 10/29/19 2034  . OLANZapine (ZYPREXA) tablet 2.5 mg  2.5 mg Oral BID Charm Rings, NP   2.5 mg at 10/30/19 2505   Current Outpatient Medications  Medication Sig Dispense Refill  . buPROPion (WELLBUTRIN SR) 200 MG 12 hr tablet Take 200 mg by mouth 2 (two) times daily.    . hydrOXYzine (VISTARIL) 25 MG capsule 25-50 mg every 8 (eight) hours as needed for anxiety.     Marland Kitchen QUEtiapine (SEROQUEL) 100 MG tablet Take 100 mg by mouth at bedtime.      Musculoskeletal: Strength & Muscle Tone: within normal limits Gait & Station: normal Patient leans: N/A  Psychiatric Specialty Exam: Physical  Exam  Nursing note and vitals reviewed. Constitutional: She is oriented to person, place, and time. She appears well-developed and well-nourished.  HENT:  Head: Normocephalic.  Respiratory: Effort normal.  Musculoskeletal:        General: Normal range of motion.     Cervical back: Normal range of motion.  Neurological: She is alert and oriented to person, place, and time.  Psychiatric: Her speech is normal and behavior is normal. Her mood appears anxious. Cognition and memory are normal. She expresses impulsivity. She exhibits a depressed mood. She expresses suicidal ideation.    Review of Systems  Psychiatric/Behavioral: Positive for dysphoric mood and suicidal ideas. The patient is nervous/anxious.   All other systems reviewed and are negative.   Blood pressure 123/84, pulse  87, temperature 98.2 F (36.8 C), temperature source Oral, resp. rate 18, SpO2 98 %.There is no height or weight on file to calculate BMI.  General Appearance: Casual  Eye Contact:  Good  Speech:  Clear and Coherent  Volume:  Normal  Mood:  Anxious and Depressed  Affect:  Congruent  Thought Process:  Coherent and Descriptions of Associations: Intact  Orientation:  Full (Time, Place, and Person)  Thought Content:  Rumination  Suicidal Thoughts:  Yes.  with intent/plan  Homicidal Thoughts:  No  Memory:  Immediate;   Fair Recent;   Fair Remote;   Fair  Judgement:  Poor  Insight:  Fair  Psychomotor Activity:  Normal  Concentration:  Concentration: Fair and Attention Span: Fair  Recall:  FiservFair  Fund of Knowledge:  Fair  Language:  Good  Akathisia:  No  Handed:  Right  AIMS (if indicated):     Assets:  Leisure Time Physical Health Resilience Social Support  ADL's:  Intact  Cognition:  WNL  Sleep:        Treatment Plan Summary: Daily contact with patient to assess and evaluate symptoms and progress in treatment, Medication management and Plan major depressive disorder, recurrent, severe without psychosis:  -Continue Celexa 20 mg daily -Continue Zyprexa 2.5 mg BID -[facility] in the a.m.  Disposition: Recommend psychiatric Inpatient admission when medically cleared.  Nanine MeansJamison Asberry Lascola, NP 10/30/2019 12:51 PM

## 2019-10-30 NOTE — Progress Notes (Signed)
CSW re-faxed referrals to the following facilities for review for inpatient placement:  [facility]   [facility]   [facility]   [facility]  [facility]   [facility]   [facility]   [facility]   [facility]   [facility]  [facility]   [facility]  [facility]   Ruthann Cancer MSW, LCSWA Clincal Social Worker Disposition  [facility] Ph: [phone] Fax: [phone]

## 2019-10-31 MED ORDER — CITALOPRAM HYDROBROMIDE 20 MG PO TABS: 20.0000 mg | ORAL_TABLET | Freq: Every day | ORAL | 0 refills | Status: AC

## 2019-10-31 MED ORDER — OLANZAPINE 2.5 MG PO TABS: 2.5000 mg | ORAL_TABLET | Freq: Two times a day (BID) | ORAL | 0 refills | Status: AC

## 2019-10-31 NOTE — Progress Notes (Signed)
TOC CM received call from [facility], accepting patient. Provided contact for CSW. Isidoro Donning RN CCM, [facility] ED TOC CM [phone]

## 2019-10-31 NOTE — Discharge Summary (Signed)
  Patient to be transferred to [facility] for inpatient psychiatric treatment 

## 2019-10-31 NOTE — BH Assessment (Signed)
[facility] Assessment Progress Note  Per Shuvon Rankin, FNP, this pt requires psychiatric hospitalization at this time.  Pt presents under IVC initiated by pt's daughter, which EDP Gwyneth Sprout, MD has upheld.  At 12:33 Morrie Sheldon calls from [facility] to report that pt has been accepted to their facility by Dr Joseph Art.  Shuvon concurs with this decision.  Pt's nurse, Florentina Addison, has been notified, and agrees to call report to [phone].  Pt is to be transported via [facility].  Doylene Canning Behavioral Health Coordinator [phone]

## 2019-10-31 NOTE — ED Notes (Signed)
Called report to Raeanne Gathers at [facility] [phone]. Called [facility] for transport. They estimate it will take 2-3 hours to arrive at [facility] ED.

## 2019-10-31 NOTE — ED Provider Notes (Signed)
Emergency Medicine Observation Re-evaluation Note  Christina Cruz is a 64 y.o. female, seen on rounds today.  Pt initially presented to the ED for complaints of Medical Clearance (IVC) Currently, the patient is awaiting placement. Pt has no complaints.  Physical Exam  BP 137/84 (BP Location: Right Arm)   Pulse 73   Temp 98.3 F (36.8 C) (Oral)   Resp 18   SpO2 97%  Physical Exam Vitals and nursing note reviewed.  Constitutional:      Appearance: She is well-developed.  HENT:     Head: Normocephalic and atraumatic.  Cardiovascular:     Rate and Rhythm: Normal rate.  Pulmonary:     Effort: Pulmonary effort is normal.  Abdominal:     General: Bowel sounds are normal.  Musculoskeletal:     Cervical back: Normal range of motion and neck supple.  Skin:    General: Skin is warm and dry.  Neurological:     Mental Status: She is alert and oriented to person, place, and time.    ED Course / MDM  EKG:EKG Interpretation  Date/Time:  Friday October 28 2019 17:26:36 EDT Ventricular Rate:  95 PR Interval:  176 QRS Duration: 82 QT Interval:  346 QTC Calculation: 434 R Axis:   171 Text Interpretation: Normal sinus rhythm Right axis deviation No previous tracing Confirmed by Gwyneth Sprout ([id number]) on 10/28/2019 5:37:22 PM    I have reviewed the labs performed to date as well as medications administered while in observation.  Recent changes in the last 24 hours include NONE. Plan  Current plan is for patient to await placement. Patient is not under full IVC at this time.   Derwood Kaplan, MD 10/31/19 [id number]

## 2020-12-28 DEATH — deceased

## 2021-06-24 IMAGING — CT CT HEAD W/O CM
3 series · 15 of 47 positions shown, 18 images · non-contrast
Comparison: No pertinent prior studies available for comparison.

CLINICAL DATA: Altered mental status (AMS), unclear cause.

EXAM:
CT HEAD WITHOUT CONTRAST
TECHNIQUE: Contiguous axial images were obtained from the base of the skull
through the vertex without intravenous contrast.

[Series 2: head wo · axial · 0.43mm/px · z∈[-179,-54]mm · 9 of 31 slices shown, 12 images]
[im 3/31  brain]
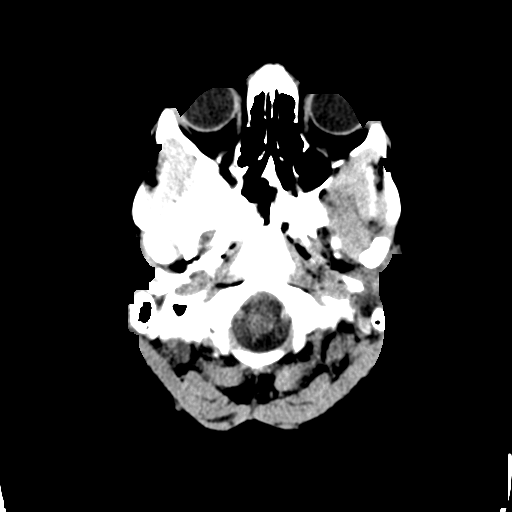
[im 3/31  bone]
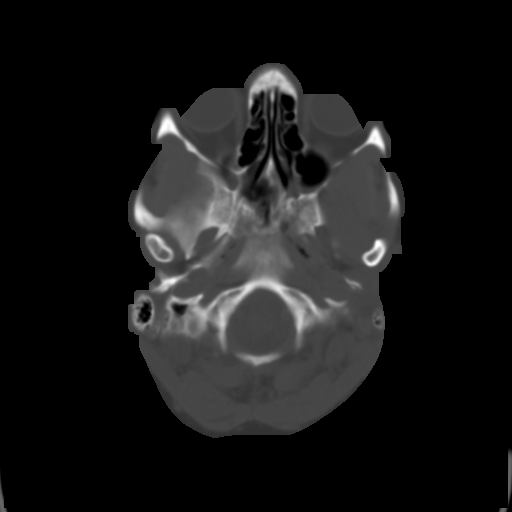
[im 6/31  brain]
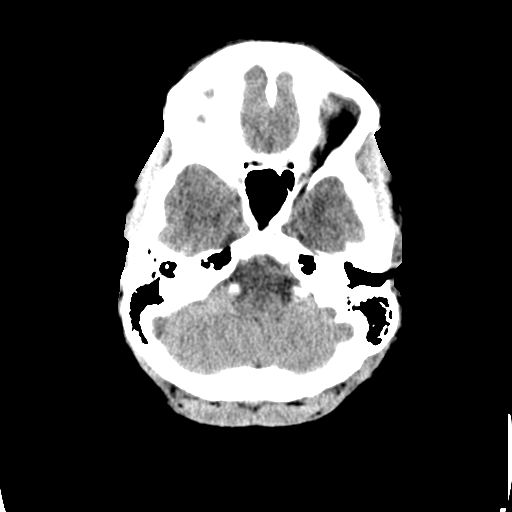
[im 9/31  brain]
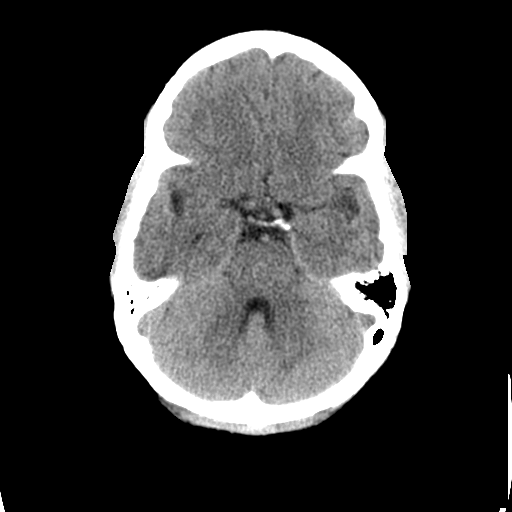
[im 12/31  brain]
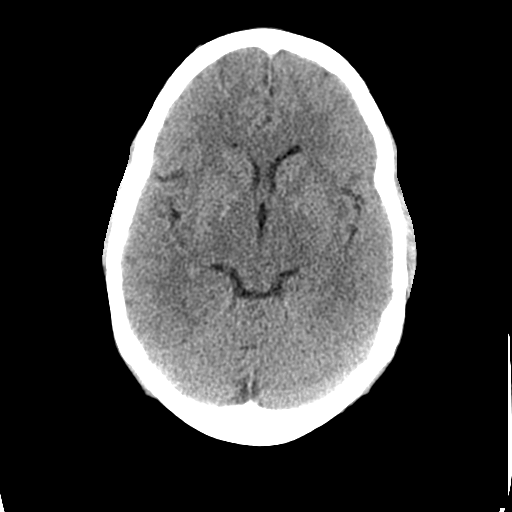
[im 16/31  brain]
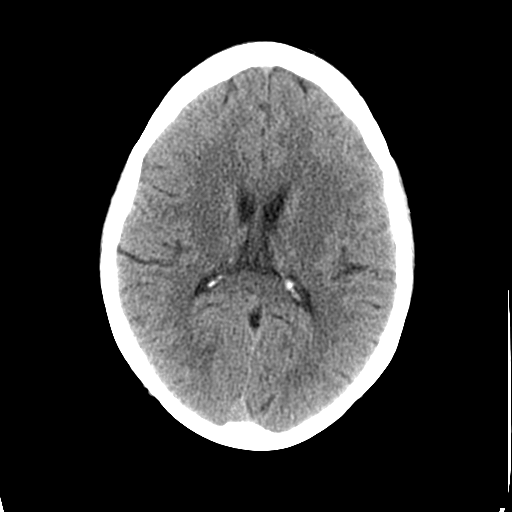
[im 16/31  bone]
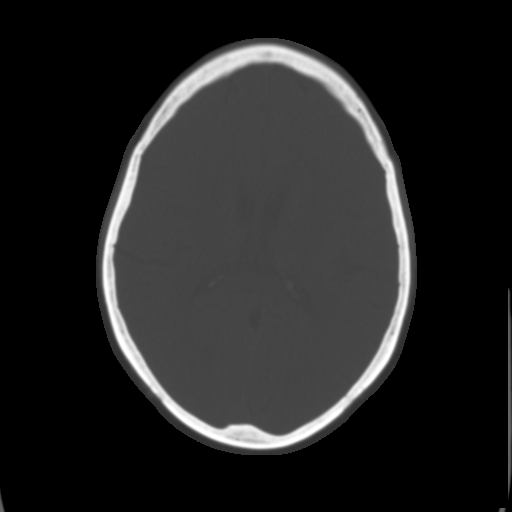
[im 19/31  brain]
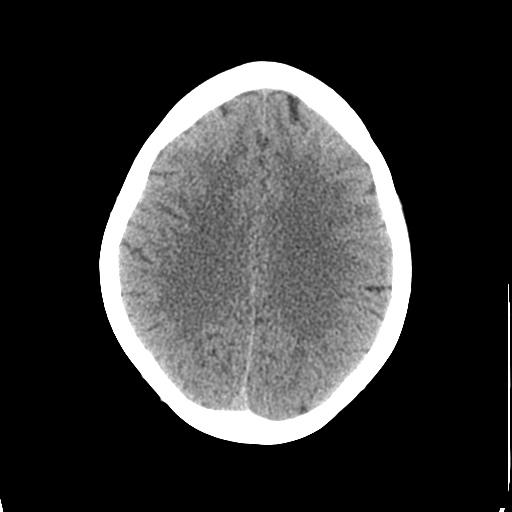
[im 22/31  brain]
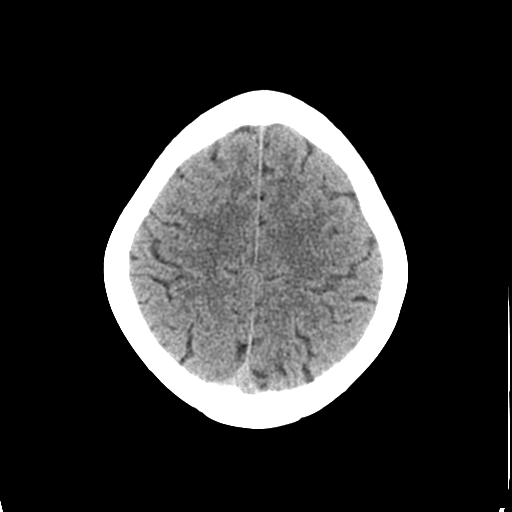
[im 25/31  brain]
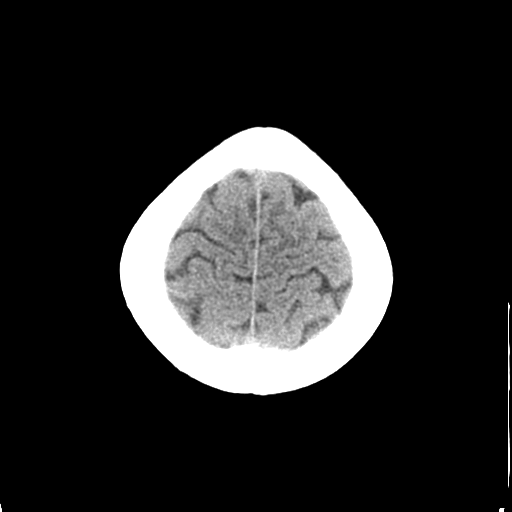
[im 28/31  brain]
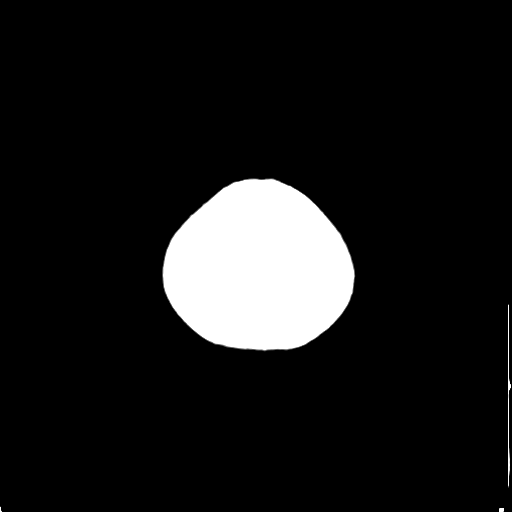
[im 28/31  bone]
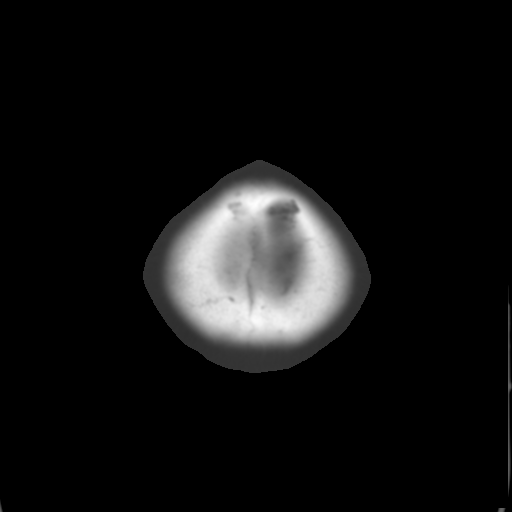

[Series 5: coronal soft tissue · coronal · 0.30mm/px · 3 of 68 slices shown]
[im 23/68  brain]
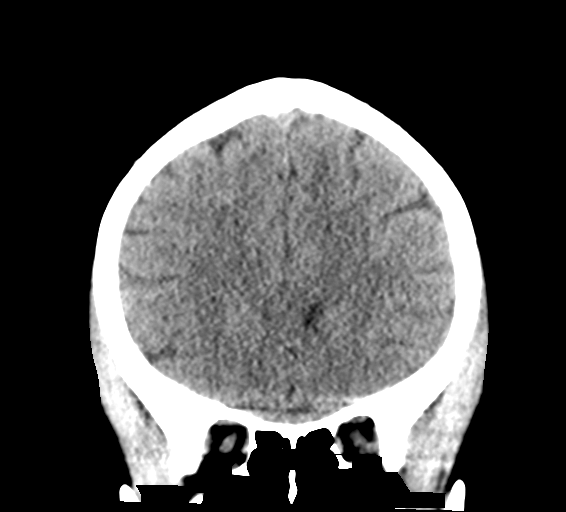
[im 30/68  brain]
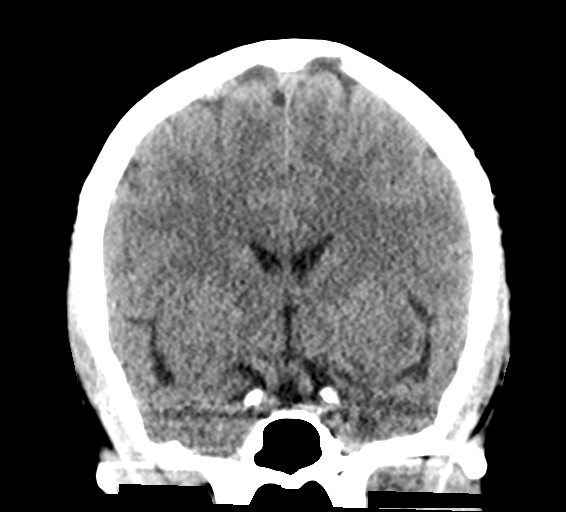
[im 38/68  brain]
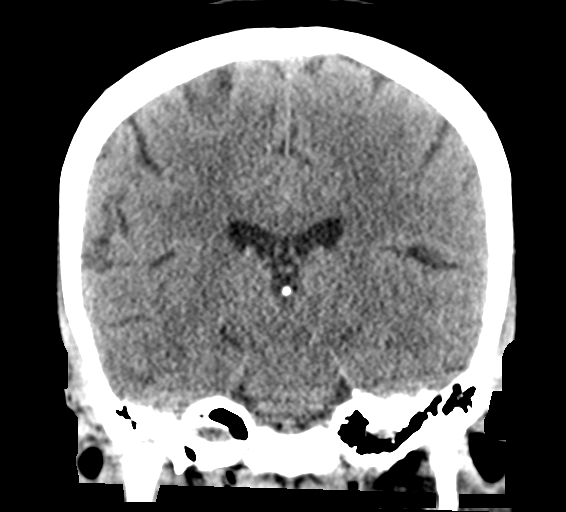

[Series 6: sagittal soft tissue · sagittal · 0.30mm/px · 3 of 56 slices shown]
[im 19/56  brain]
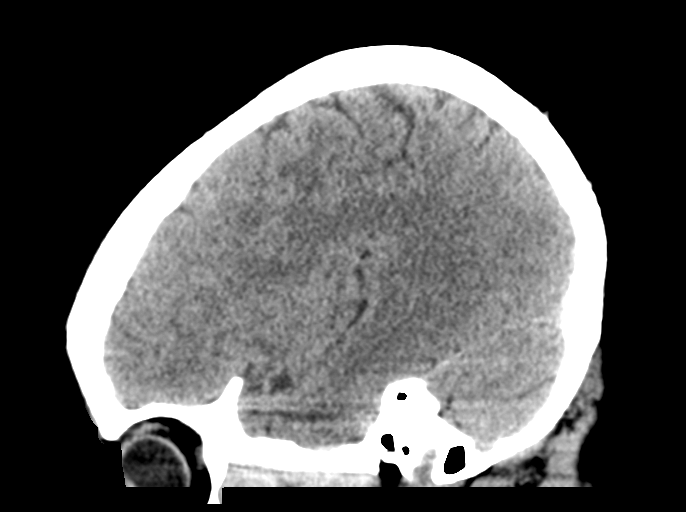
[im 28/56  brain]
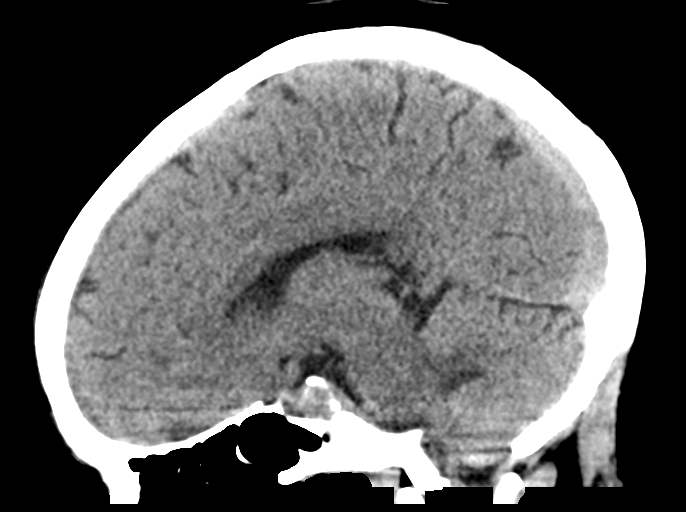
[im 37/56  brain]
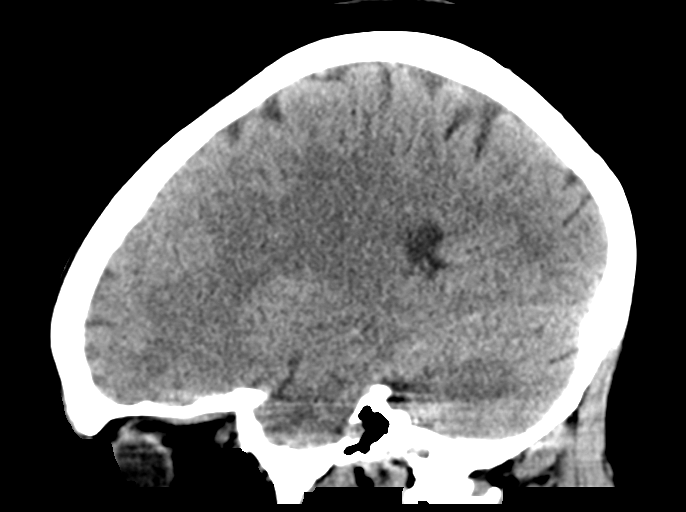

[15 of 47 positions shown; findings below may reference images not displayed]

FINDINGS: Brain:

There is no acute intracranial hemorrhage.

No demarcated cortical infarct.

No extra-axial fluid collection.

No evidence of intracranial mass.

No midline shift.

Vascular: No hyperdense vessel.

Skull: Normal. Negative for fracture or focal lesion.

Sinuses/Orbits: Visualized orbits show no acute finding. No
significant paranasal sinus disease or mastoid effusion at the
imaged levels.
IMPRESSION: Unremarkable non-contrast CT appearance of the brain. No evidence of
acute intracranial abnormality.
